# Patient Record
Sex: Male | Born: 2005 | Race: White | Hispanic: No | Marital: Single | Smoking: Never smoker
Health system: Southern US, Community
[De-identification: ages and names within clinical notes are randomized; demographics above are authoritative.]

## PROBLEM LIST (undated history)

## (undated) DIAGNOSIS — L309 Dermatitis, unspecified: Secondary | ICD-10-CM

## (undated) DIAGNOSIS — J45909 Unspecified asthma, uncomplicated: Secondary | ICD-10-CM

## (undated) HISTORY — PX: FEMUR FRACTURE SURGERY: SHX633

## (undated) HISTORY — PX: DENTAL SURGERY: SHX609

## (undated) HISTORY — PX: MULTIPLE TOOTH EXTRACTIONS: SHX2053

## (undated) HISTORY — PX: CIRCUMCISION: SHX1350

## (undated) HISTORY — PX: WRIST FRACTURE SURGERY: SHX121

---
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
## 2023-02-02 ENCOUNTER — Ambulatory Visit: Payer: Medicaid Other | Admitting: Neurology

## 2023-02-24 ENCOUNTER — Ambulatory Visit: Payer: Medicaid Other | Admitting: Neurology

## 2023-03-02 ENCOUNTER — Ambulatory Visit: Payer: Medicaid Other | Admitting: Neurology

## 2023-05-06 ENCOUNTER — Telehealth: Payer: Self-pay

## 2023-05-06 ENCOUNTER — Other Ambulatory Visit (HOSPITAL_COMMUNITY): Payer: Self-pay

## 2023-05-06 NOTE — Telephone Encounter (Signed)
*  [facility]  Pharmacy Patient Advocate Encounter   Received notification from Fax that prior authorization for Emgality  120MG /ML auto-injectors (migraine)  is required/requested.   Insurance verification completed.   The patient is insured through [facility] .   Per test claim: PA required; PA submitted to above mentioned insurance via CoverMyMeds Key/confirmation #/EOC [facility] Status is pending

## 2023-05-06 NOTE — Telephone Encounter (Signed)
 Pharmacy Patient Advocate Encounter  Received notification from [facility] that Prior Authorization for Emgality  120MG /ML auto-injectors (migraine)  has been APPROVED from 05/06/2023 to 05/05/2024. Unable to obtain price due to refill too soon rejection, last fill date 04/20/2023 next available fill date02/13/2025   PA #/Case ID/Reference #: [id number]

## 2023-05-11 ENCOUNTER — Ambulatory Visit (INDEPENDENT_AMBULATORY_CARE_PROVIDER_SITE_OTHER): Payer: Medicaid Other | Admitting: Neurology

## 2023-05-11 VITALS — BP 130/80 | HR 85 | Ht 68.0 in | Wt 233.9 lb

## 2023-05-11 DIAGNOSIS — G43709 Chronic migraine without aura, not intractable, without status migrainosus: Secondary | ICD-10-CM | POA: Diagnosis not present

## 2023-05-11 MED ORDER — EMGALITY 120 MG/ML ~~LOC~~ SOAJ: 1.0000 | SUBCUTANEOUS | 11 refills | Status: AC

## 2023-05-11 MED ORDER — NURTEC 75 MG PO TBDP: 75.0000 mg | ORAL_TABLET | ORAL | 11 refills | Status: AC | PRN

## 2023-05-11 MED ORDER — RIZATRIPTAN BENZOATE 10 MG PO TBDP: 10.0000 mg | ORAL_TABLET | ORAL | 5 refills | Status: AC | PRN

## 2023-05-11 NOTE — Patient Instructions (Signed)
Great to see you today.  We will continue current migraine medications.  Please reach out if your headaches increase.  Thanks!!

## 2023-05-11 NOTE — Progress Notes (Signed)
Patient: Russell Coleman Date of Birth: [birth year]  Reason for Visit: Follow up History from: Patient Primary Neurologist: Dr. Lucia Gaskins   ASSESSMENT AND PLAN 18 y.o. year old male   1.  Chronic migraine headache -Currently pleased with migraine control 1-2 a week  -Continue Emgality monthly injection for migraine prevention -Continue Nurtec 75 mg as needed for acute headache, has Maxalt if needed as second option.   -Next steps: [name] -Previously tried and failed: Maxalt, Topamax, nortriptyline, held propranolol due to history of asthma, Tylenol, Decadron injections, ibuprofen, Zofran, Reglan injections, Zofran -Follow-up with me in 1 year or sooner if needed  HISTORY  Dr. Lucia Gaskins 09/03/21 HPI:  Russell Coleman is a 18 y.o. male here as requested by Cox, Grafton Folk, MD for migraines. PMHx migraines.  I reviewed Dr. Renea Ee notes, patient is been seen by [facility], she has a history of migraines, they have discussed limiting caffeinated fluid intake, getting uninterrupted sleep for 6 to 8 hours a night, 30 minutes physical activity every day, improving coping mechanisms for stressors, at last appointment they started ondansetron for nausea and vomiting, at the time patient's had headaches up to 3 days, recurrent, characterized as throbbing anytime of the day, in the temporal areas, and nausea, Advil helped, no vomiting but feels nauseated, worsens with loud noises and bright lights, struggling in school this year missing lots of days due to increased anxiety.   Migraines started in middle school, mother/father/grandparents/cousins all have migraines so extensive family history. In HS it was more stressful, stress can trigger them and make then worse. Advil stopped working, tylenol stopped working. Started getting worse, more frequent. Now he has migraines that last up to 3 days, sleep helps but still wakes up with them, throbbing/pulsating/pounding/nausea/light and sound sensitivity  hurts to move, also associated irritability, more on te left in the forehead and eye but can spread to both side. Headaches on average 20 headache days a month, and out of those at least 10 are moderate to severe migraines, No other focal neurologic deficits, associated symptoms, inciting events or modifiable factors.   Reviewed notes, labs and imaging from outside physicians, which showed:   From a review of records, medications tried that can be used in migraine or headache management include: Tylenol, Decadron injections, ibuprofen, Zofran, Reglan injections, Zofran   CT 01/24/2015 head showed No acute intracranial abnormalities including mass lesion or mass effect, hydrocephalus, extra-axial fluid collection, midline shift, hemorrhage, or acute infarction, large ischemic events (personally reviewed images) brain parenchyma was normal  Update December 10, 2021 SS: CBC, CMP, TSH, T4 were normal 09/03/21. Having 3 migraines a week. Taking Topamax 100 mg at bedtime, denies any improvement. Maxalt does help, he uses supply every month. Had daily headache, but 3 a week are bad migraine. Sometimes misses school because of headache. 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 Doesn't sleep great at night, he has trouble sleeping, tosses and turns. Stopped drinking soda. Stopped OTC medications. Works on the weekends at [employer].  Update April 15, 2022 SS: has been on Emgality for 2 months, thinks its helping. Are no longer clustered. 2-3 migraine a week, less severe. Takes Maxalt 2 days a week, it does help, he does run out on a monthly basis, gets 9 tablets. It eases in an hour. He misses 1-2 days a week of school. Got new glasses for near sidedness, has helped migraines.   Update Aug 12, 2022 SS: Remains on Emgality. 1-2 migraines a week. Are less severe and last less time. Had DOT physical, tested for OSA, it was normal. 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 He takes Maxalt, it helps but has to take 2 doses. Tried the [employer]	[place], it worked great! Took the migraine away within 15 minutes, no side effect. In general 70% better with migraines. On average of 7 days (1 migraine, 2 headache days), prior to [facility] was having daily headache, 3 severe migraines a week.   Update May 11, 2023 SS: [place] agent. Remains on Emgality. About 1-2 migraines a week. Nurtec makes them go away immediately. He is pleased and very happy with his rescue options. Tries Maxalt first, if no benefit goes to [employer]	[place]. Is not missing any work from migraines.   REVIEW OF SYSTEMS: Out of a complete 14 system review of symptoms, the patient complains only of the following symptoms, and all other reviewed systems are negative.  See HPI  ALLERGIES: Allergies  Allergen Reactions   Strawberry (Diagnostic) Itching and Rash    HOME MEDICATIONS: Outpatient Medications Prior to Visit  Medication Sig Dispense Refill   acyclovir (ZOVIRAX) 400 MG tablet Take 400 mg by mouth 3 (three) times daily as needed.     ibuprofen (ADVIL,MOTRIN) 100 MG/5ML suspension Take 200 mg by mouth every 6 (six) hours as needed for fever.      Galcanezumab-gnlm (EMGALITY) 120 MG/ML SOAJ Inject 1 Pen into the skin every 30 (thirty) days. 1 mL 11   Rimegepant Sulfate (NURTEC) 75 MG TBDP Take 1 tablet (75 mg total) by mouth as needed (take 1 tablet at onset of headache, max is 1 tablet in 24 hours). 8 tablet 11   rizatriptan (MAXALT-MLT) 10 MG disintegrating tablet TAKE 1 TABLET BY MOUTH AS NEEDED FOR MIGRAINE. MAY REPEAT IN 2 HOURS IF NEEDED 9 tablet 5   No facility-administered medications prior to visit.    PAST MEDICAL HISTORY: Past Medical History:  Diagnosis Date   Asthma    Eczema     PAST SURGICAL HISTORY: Past Surgical History:  Procedure Laterality Date   CIRCUMCISION     DENTAL SURGERY     FEMUR FRACTURE SURGERY      HARDWARE REMOVAL N/A 01/28/2015   Procedure: HARDWARE REMOVAL;  Surgeon: Sheral Apley, MD;  Location: [facility];  Service: Orthopedics;  Laterality: N/A;   INTRAMEDULLARY (IM) NAIL INTERTROCHANTERIC Right 01/28/2015   Procedure: INTRAMEDULLARY (IM) NAIL INTERTROCHANTRIC;  Surgeon: Sheral Apley, MD;  Location: [facility];  Service: Orthopedics;  Laterality: Right;   MULTIPLE TOOTH EXTRACTIONS     ORIF FEMUR FRACTURE Right 01/24/2015   Procedure: OPEN REDUCTION INTERNAL FIXATION (ORIF) DISTAL FEMUR FRACTURE and splint placement of right arm;  Surgeon: Sheral Apley, MD;  Location: [facility];  Service: Orthopedics;  Laterality: Right;   WRIST FRACTURE SURGERY      FAMILY HISTORY: Family History  Problem Relation Age of Onset   Hypertension Mother    Hypothyroidism Mother    Other Mother        tachycardia   Migraines Mother    Headache Mother    Migraines Father    Headache Father    Eczema Brother    Asthma Maternal Grandmother    COPD Maternal Grandmother    Hyperlipidemia Maternal Grandmother    Hypertension Maternal Grandmother     SOCIAL HISTORY: Social History  Socioeconomic History   Marital status: Single    Spouse name: Not on file   Number of children: Not on file   Years of education: Not on file   Highest education level: Not on file  Occupational History   Not on file  Tobacco Use   Smoking status: Never    Passive exposure: Yes   Smokeless tobacco: Not on file  Substance and Sexual Activity   Alcohol use: No   Drug use: Not on file   Sexual activity: Not on file  Other Topics Concern   Not on file  Social History Narrative   ** Merged History Encounter ** ** Data from: 01/23/15 Enc Dept: [facility] ** Data from: 01/31/15 Enc Dept: [facility] with Mom, Dad, Brother, Sister. Dad smokes outside near fence, away from house. 1 dog at home.   Right handed   Caffeine-24oz daily   Social Drivers of Health   Financial Resource Strain: Not on  file  Food Insecurity: Not on file  Transportation Needs: Not on file  Physical Activity: Not on file  Stress: Not on file  Social Connections: Not on file  Intimate Partner Violence: Not on file    PHYSICAL EXAM  Vitals:   05/11/23 0947  BP: (!) 152/98  Pulse: 85  Weight: 233 lb 14.5 oz (106.1 kg)  Height: 5\' 8"  (1.727 m)   Body mass index is 35.57 kg/m.  Generalized: Well developed, in no acute distress  Neurological examination  Mentation: Alert oriented to time, place, history taking. Follows all commands speech and language fluent Cranial nerve II-XII: Pupils were equal round reactive to light. Extraocular movements were full, visual field were full on confrontational test. Facial sensation and strength were normal.  Head turning and shoulder shrug  were normal and symmetric. Motor: The motor testing reveals 5 over 5 strength of all 4 extremities. Good symmetric motor tone is noted throughout.  Sensory: Sensory testing is intact to soft touch on all 4 extremities. No evidence of extinction is noted.  Coordination: Cerebellar testing reveals good finger-nose-finger and heel-to-shin bilaterally.  Gait and station: Gait is normal.   DIAGNOSTIC DATA (LABS, IMAGING, TESTING) - I reviewed patient records, labs, notes, testing and imaging myself where available.  Lab Results  Component Value Date   WBC 8.1 09/03/2021   HGB 15.4 09/03/2021   HCT 44.8 09/03/2021   MCV 89 09/03/2021   PLT 306 09/03/2021      Component Value Date/Time   NA 141 09/03/2021 1331   K 4.5 09/03/2021 1331   CL 102 09/03/2021 1331   CO2 26 09/03/2021 1331   GLUCOSE 77 09/03/2021 1331   GLUCOSE 99 01/27/2015 0700   BUN 12 09/03/2021 1331   CREATININE 0.88 09/03/2021 1331   CALCIUM 9.6 09/03/2021 1331   PROT 7.1 09/03/2021 1331   ALBUMIN 4.4 09/03/2021 1331   AST 16 09/03/2021 1331   ALT 15 09/03/2021 1331   ALKPHOS 97 09/03/2021 1331   BILITOT 0.3 09/03/2021 1331   GFRNONAA NOT CALCULATED  01/27/2015 0700   GFRAA NOT CALCULATED 01/27/2015 0700   No results found for: "CHOL", "HDL", "LDLCALC", "LDLDIRECT", "TRIG", "CHOLHDL" No results found for: "HGBA1C" No results found for: "VITAMINB12" Lab Results  Component Value Date   TSH 2.870 09/03/2021   Margie Ege, AGNP-C, DNP 05/11/2023, 10:01 AM [facility] [address], [address] [place], [place] [address] [phone]

## 2023-09-20 ENCOUNTER — Telehealth: Payer: Self-pay | Admitting: Pharmacist

## 2023-09-20 NOTE — Telephone Encounter (Signed)
 Pharmacy Patient Advocate Encounter   Received notification from CoverMyMeds that prior authorization for Nurtec 75MG  dispersible tablets is required/requested.   Insurance verification completed.   The patient is insured through [facility] .   Per test claim: PA required; PA submitted to above mentioned insurance via CoverMyMeds Key/confirmation #/EOC [id number] Status is pending

## 2023-09-20 NOTE — Telephone Encounter (Signed)
 Pharmacy Patient Advocate Encounter  Received notification from [facility] that Prior Authorization for Nurtec 75MG  dispersible tablets has been APPROVED from 09/20/2023 to 09/19/2024   PA #/Case ID/Reference #: [id number]

## 2023-09-23 ENCOUNTER — Ambulatory Visit: Payer: Medicaid Other | Admitting: Neurology

## 2024-05-10 ENCOUNTER — Ambulatory Visit: Payer: Medicaid Other | Admitting: Neurology
